# Patient Record
Sex: Male | Born: 1965 | Race: White | Hispanic: No | Smoking: Never smoker
Health system: Southern US, Community
[De-identification: ages and names within clinical notes are randomized; demographics above are authoritative.]

## PROBLEM LIST (undated history)

## (undated) DIAGNOSIS — T7840XA Allergy, unspecified, initial encounter: Secondary | ICD-10-CM

## (undated) DIAGNOSIS — Z889 Allergy status to unspecified drugs, medicaments and biological substances status: Secondary | ICD-10-CM

## (undated) DIAGNOSIS — I10 Essential (primary) hypertension: Secondary | ICD-10-CM

## (undated) DIAGNOSIS — J45909 Unspecified asthma, uncomplicated: Secondary | ICD-10-CM

## (undated) DIAGNOSIS — E7800 Pure hypercholesterolemia, unspecified: Secondary | ICD-10-CM

## (undated) DIAGNOSIS — K219 Gastro-esophageal reflux disease without esophagitis: Secondary | ICD-10-CM

## (undated) HISTORY — PX: HERNIA REPAIR: SHX51

## (undated) HISTORY — DX: Essential (primary) hypertension: I10

## (undated) HISTORY — DX: Gastro-esophageal reflux disease without esophagitis: K21.9

## (undated) HISTORY — DX: Unspecified asthma, uncomplicated: J45.909

## (undated) HISTORY — DX: Allergy status to unspecified drugs, medicaments and biological substances: Z88.9

## (undated) HISTORY — DX: Pure hypercholesterolemia, unspecified: E78.00

## (undated) HISTORY — DX: Allergy, unspecified, initial encounter: T78.40XA

---

## 2004-10-02 ENCOUNTER — Encounter: Admission: RE | Admit: 2004-10-02 | Discharge: 2004-10-02 | Payer: Self-pay | Admitting: Family Medicine

## 2013-02-13 ENCOUNTER — Ambulatory Visit: Payer: Self-pay | Admitting: Cardiology

## 2013-02-21 ENCOUNTER — Encounter: Payer: Self-pay | Admitting: Cardiology

## 2013-02-22 ENCOUNTER — Encounter: Payer: Self-pay | Admitting: Cardiology

## 2013-02-22 ENCOUNTER — Ambulatory Visit (INDEPENDENT_AMBULATORY_CARE_PROVIDER_SITE_OTHER): Payer: BC Managed Care – PPO | Admitting: Cardiology

## 2013-02-22 VITALS — BP 102/68 | HR 68 | Ht 73.0 in | Wt 196.0 lb

## 2013-02-22 DIAGNOSIS — R9431 Abnormal electrocardiogram [ECG] [EKG]: Secondary | ICD-10-CM

## 2013-02-22 DIAGNOSIS — R0789 Other chest pain: Secondary | ICD-10-CM

## 2013-02-22 DIAGNOSIS — R002 Palpitations: Secondary | ICD-10-CM

## 2013-02-22 LAB — BASIC METABOLIC PANEL
BUN: 11 mg/dL (ref 6–23)
CO2: 27 mEq/L (ref 19–32)
Calcium: 9.9 mg/dL (ref 8.4–10.5)
Chloride: 102 mEq/L (ref 96–112)
Creatinine, Ser: 0.9 mg/dL (ref 0.4–1.5)
GFR: 96.01 mL/min (ref >60.00)
GLUCOSE: 89 mg/dL (ref 70–99)
POTASSIUM: 4.2 meq/L (ref 3.5–5.1)
Sodium: 137 mEq/L (ref 135–145)

## 2013-02-22 LAB — TSH: TSH: 1.28 u[IU]/mL (ref 0.35–5.50)

## 2013-02-22 NOTE — Patient Instructions (Signed)
Your physician has requested that you have an echocardiogram. Echocardiography is a painless test that uses sound waves to create images of your heart. It provides your doctor with information about the size and shape of your heart and how well your heart's chambers and valves are working. This procedure takes approximately one hour. There are no restrictions for this procedure.  Your physician recommends that you have lab work TODAY: TSH,BMET  Your physician recommends that you schedule a follow-up appointment in: prn

## 2013-02-22 NOTE — Progress Notes (Signed)
[facility] [address], [place]  [address] Phone: [phone] Fax:  [phone]  Date:  02/22/2013   ID:  Roger Foley, DOB 1965-01-27, MRN [id number]  PCP:  No primary provider on file.   History of Present Illness: Roger Foley is a 48 y.o. male here for evaluation of chest discomfort, palpitations. Had flu like illness for 3 weeks over Christmas.  Has a history of hyperlipidemia, asthma, nonsmoker who has been experiencing chest pains/discomfort feeling like a burp or hiccup that fully doesn't develop. Can feel 10 episodes of irregular heartbeat as well. Each episode usually lasts a few seconds duration. He feels the sensation of cough and when he does cough, it seems to resolve his palpitations. Denies any significant shortness of breath, syncope, dizziness. Denies GERD. During exercise, is not experiencing any chest pain. His anus may be occurring on a treadmill periodically. He was started on Nexium to see if this helped intermittantely. EKG performed demonstrated T wave inversion in lead V1 and V2, mostly flattening in V2. Overall sinus rhythm rate 65 with no other abnormalities.   Over the past week or so, he has felt less palpitations.   Wt Readings from Last 3 Encounters:  02/22/13 196 lb (88.905 kg)     Past Medical History  Diagnosis Date  . History of seasonal allergies   . Asthma   . GERD (gastroesophageal reflux disease)   . Hypercholesteremia     No past surgical history on file.  Current Outpatient Prescriptions  Medication Sig Dispense Refill  . mometasone-formoterol (DULERA) 100-5 MCG/ACT AERO Inhale 2 puffs into the lungs 2 (two) times daily.      . Multiple Vitamin (MULTIVITAMIN) tablet Take 1 tablet by mouth daily.       No current facility-administered medications for this visit.    Allergies:   No Known Allergies  Social History:  The patient  reports that he has never smoked. He does not have any smokeless tobacco history on  file. He reports that he drinks alcohol. He reports that he does not use illicit drugs.   No family history on file. No early CAD history.   ROS:  Please see the history of present illness.   Denies any strokelike symptoms, syncope, fevers, orthopnea, PND, rash.   All other systems reviewed and negative.   PHYSICAL EXAM: VS:  BP 102/68  Pulse 68  Ht 6\' 1"  (1.854 m)  Wt 196 lb (88.905 kg)  BMI 25.86 kg/m2 Well nourished, well developed, in no acute distress HEENT: normal, [place]/AT, EOMI Neck: no JVD, normal carotid upstroke, no bruit Cardiac:  normal S1, S2; RRR; no murmur Lungs:  clear to auscultation bilaterally, no wheezing, rhonchi or rales Abd: soft, nontender, no hepatomegaly, no bruits Ext: no edema, 2+ distal pulses Skin: warm and dry GU: deferred Neuro: no focal abnormalities noted, AAO x 3  EKG:  Sinus rhythm, T-wave flattening in V2, T wave inversion V1. Personally viewed.     ASSESSMENT AND PLAN:  1. Atypical chest pain-nonexertional. He does not describe it as a pain in particular but sometimes a burp-like sensation. For now, we'll continue to monitor. Nexium as necessary. I do not feel strongly based upon his symptoms and excellent exercise tolerance that a stress test as an absolutely necessary. If symptoms worsen or become more worrisome, he will let me know. 2. Palpitations-could be paroxysmal atrial tachycardia, SVT, PACs, PVCs or potentially atrial fibrillation. Seems to be improving. Decrease caffeine  use. Avoid Sudafed. I will check a basic metabolic profile and TSH. If symptoms worsen, I will have low threshold for Holter monitor placement. 3. Abnormal EKG-T-wave changes are fairly nonspecific on his EKG. I will check an echocardiogram to ensure that there no changes in structure and function based upon his symptoms and EKG. 4. We will see him back on as-needed basis. If necessary, may trial low-dose beta blocker or calcium channel blocker to help extinguish  palpitations.  Signed, Candee Furbish, MD [facility]  02/22/2013 11:28 AM

## 2013-03-02 ENCOUNTER — Other Ambulatory Visit (HOSPITAL_COMMUNITY): Payer: BC Managed Care – PPO

## 2013-03-22 ENCOUNTER — Ambulatory Visit (HOSPITAL_COMMUNITY): Payer: BC Managed Care – PPO | Attending: Cardiology | Admitting: Radiology

## 2013-03-22 DIAGNOSIS — R079 Chest pain, unspecified: Secondary | ICD-10-CM | POA: Insufficient documentation

## 2013-03-22 DIAGNOSIS — I079 Rheumatic tricuspid valve disease, unspecified: Secondary | ICD-10-CM | POA: Insufficient documentation

## 2013-03-22 DIAGNOSIS — R0789 Other chest pain: Secondary | ICD-10-CM

## 2013-03-22 DIAGNOSIS — E785 Hyperlipidemia, unspecified: Secondary | ICD-10-CM | POA: Insufficient documentation

## 2013-03-22 DIAGNOSIS — R002 Palpitations: Secondary | ICD-10-CM | POA: Insufficient documentation

## 2013-03-22 DIAGNOSIS — R9431 Abnormal electrocardiogram [ECG] [EKG]: Secondary | ICD-10-CM

## 2013-03-22 NOTE — Progress Notes (Signed)
Echocardiogram Performed. 

## 2013-03-23 ENCOUNTER — Telehealth: Payer: Self-pay | Admitting: *Deleted

## 2013-03-23 ENCOUNTER — Telehealth: Payer: Self-pay | Admitting: Cardiology

## 2013-03-23 NOTE — Telephone Encounter (Signed)
lmtcb for echo results at 8:50am. number provided

## 2013-03-23 NOTE — Telephone Encounter (Signed)
New message    Test results

## 2013-03-28 NOTE — Telephone Encounter (Signed)
Pt is aware of ECHO results Debbie Eevie Lapp RN  

## 2017-08-24 ENCOUNTER — Other Ambulatory Visit (HOSPITAL_BASED_OUTPATIENT_CLINIC_OR_DEPARTMENT_OTHER): Payer: Self-pay | Admitting: Family Medicine

## 2017-08-24 DIAGNOSIS — R319 Hematuria, unspecified: Secondary | ICD-10-CM

## 2017-08-25 ENCOUNTER — Ambulatory Visit (HOSPITAL_BASED_OUTPATIENT_CLINIC_OR_DEPARTMENT_OTHER)
Admission: RE | Admit: 2017-08-25 | Discharge: 2017-08-25 | Disposition: A | Discharge status: Home / Self Care | Payer: BLUE CROSS/BLUE SHIELD | Source: Ambulatory Visit | Attending: Family Medicine | Admitting: Family Medicine

## 2017-08-25 DIAGNOSIS — K439 Ventral hernia without obstruction or gangrene: Secondary | ICD-10-CM | POA: Insufficient documentation

## 2017-08-25 DIAGNOSIS — K5730 Diverticulosis of large intestine without perforation or abscess without bleeding: Secondary | ICD-10-CM | POA: Insufficient documentation

## 2017-08-25 DIAGNOSIS — R319 Hematuria, unspecified: Secondary | ICD-10-CM | POA: Insufficient documentation

## 2017-12-14 ENCOUNTER — Other Ambulatory Visit (HOSPITAL_BASED_OUTPATIENT_CLINIC_OR_DEPARTMENT_OTHER): Payer: Self-pay | Admitting: Physician Assistant

## 2017-12-14 DIAGNOSIS — R1031 Right lower quadrant pain: Secondary | ICD-10-CM

## 2017-12-21 ENCOUNTER — Ambulatory Visit (HOSPITAL_BASED_OUTPATIENT_CLINIC_OR_DEPARTMENT_OTHER)
Admission: RE | Admit: 2017-12-21 | Discharge: 2017-12-21 | Disposition: A | Discharge status: Home / Self Care | Payer: Self-pay | Source: Ambulatory Visit | Attending: Physician Assistant | Admitting: Physician Assistant

## 2017-12-21 DIAGNOSIS — K429 Umbilical hernia without obstruction or gangrene: Secondary | ICD-10-CM | POA: Insufficient documentation

## 2017-12-21 DIAGNOSIS — R1031 Right lower quadrant pain: Secondary | ICD-10-CM

## 2019-03-03 IMAGING — US US PELVIS LIMITED
1 series · 14 of 25 positions shown · non-contrast
Comparison: None.

Correlation: CT abdomen and pelvis 08/23/2017.

CLINICAL DATA: RIGHT lower quadrant pain after exercise of physical
exertion

EXAM:
LIMITED ULTRASOUND OF PELVIS
TECHNIQUE: Limited transabdominal ultrasound examination of the pelvis was
performed at the level of the umbilicus.

[Series 1: us pelvis limited · 0.07mm/px · 25 acquisitions, 14 frames shown]
[im 1/25]
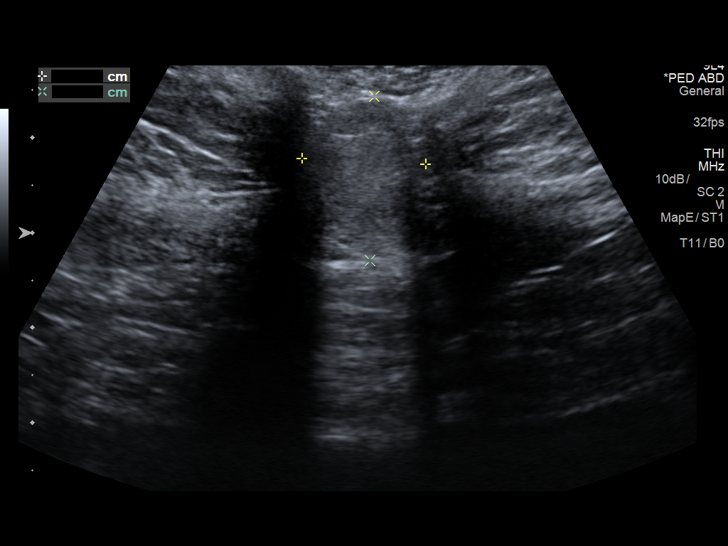
[im 3/25]
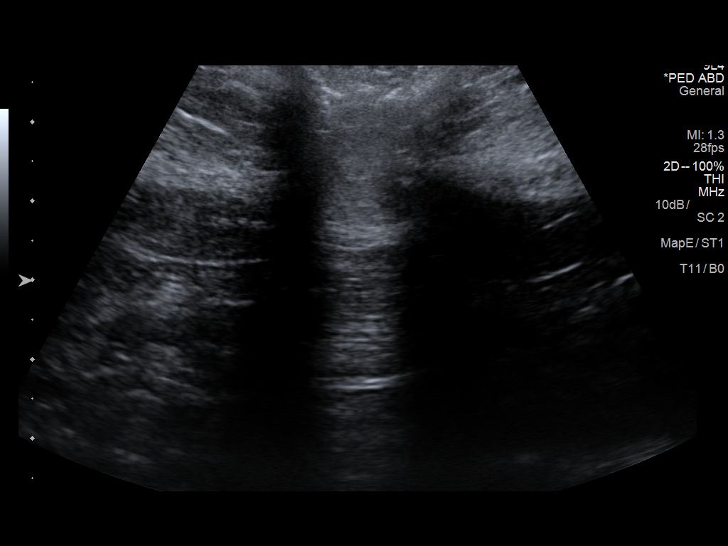
[im 5/25]
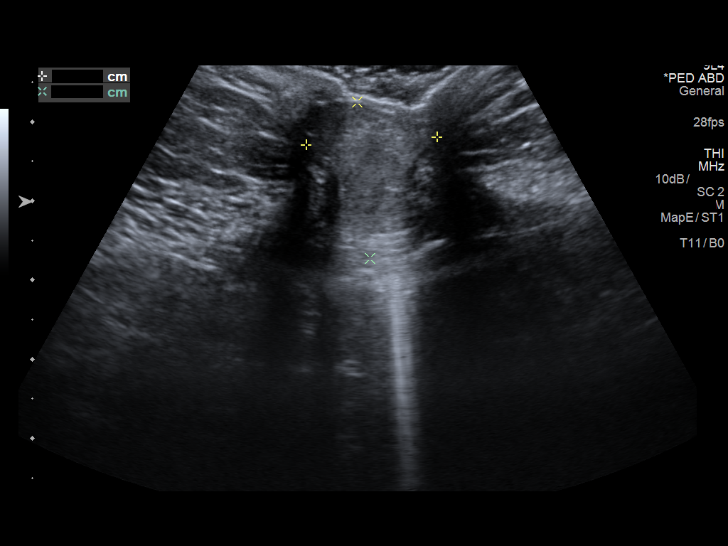
[im 7/25]
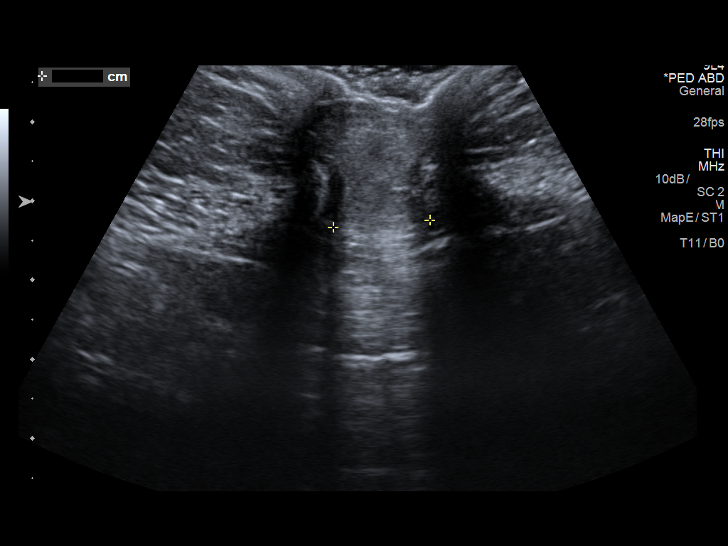
[im 9/25]
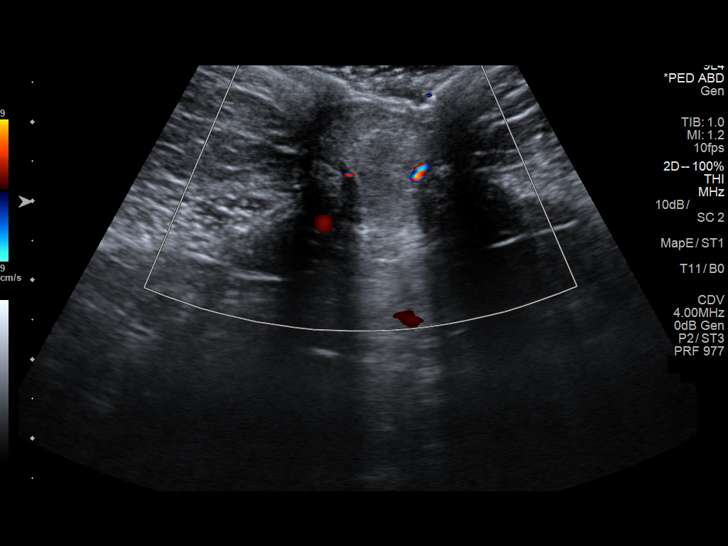
[im 10/25]
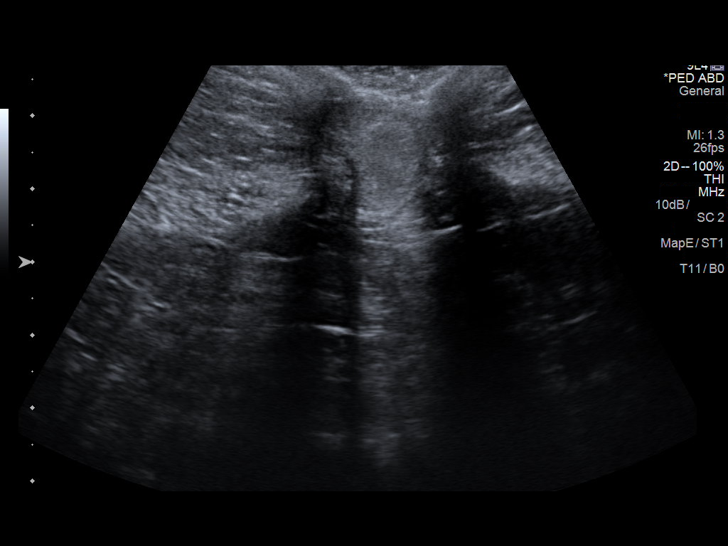
[im 12/25]
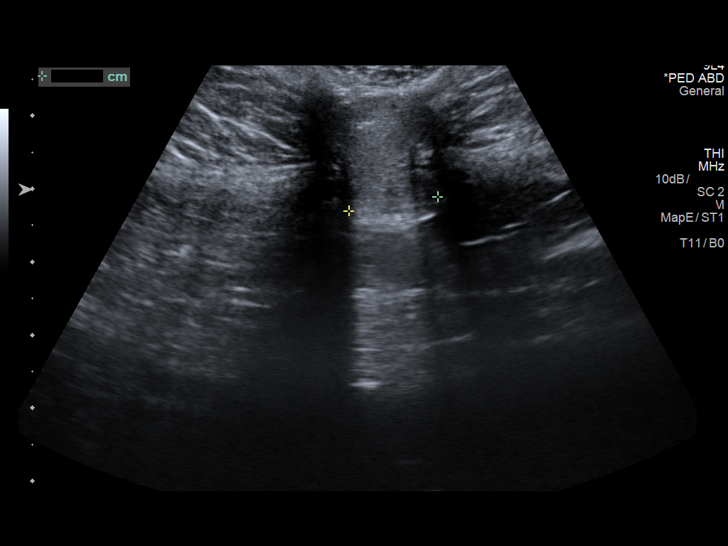
[im 14/25]
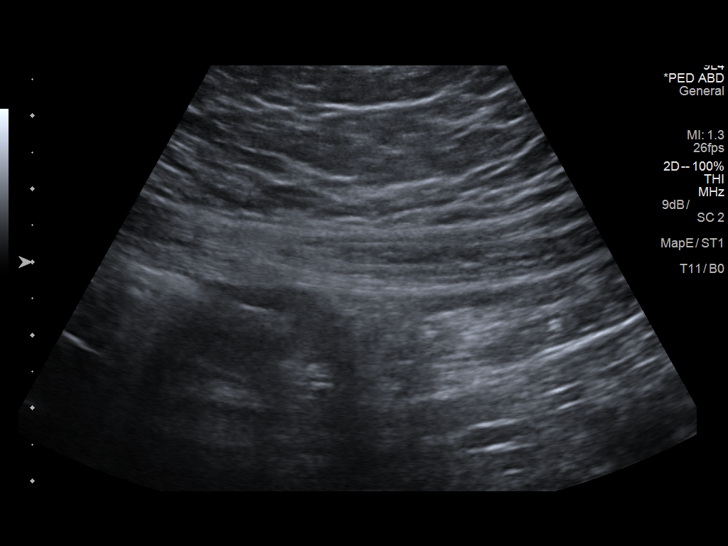
[im 16/25]
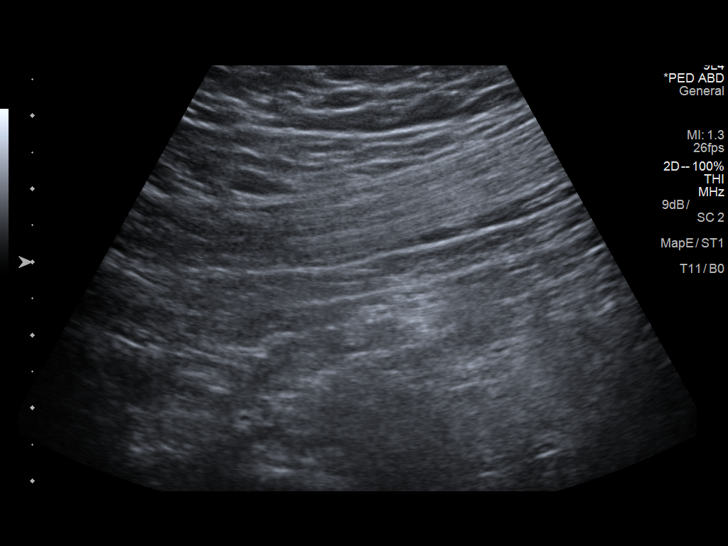
[im 17/25]
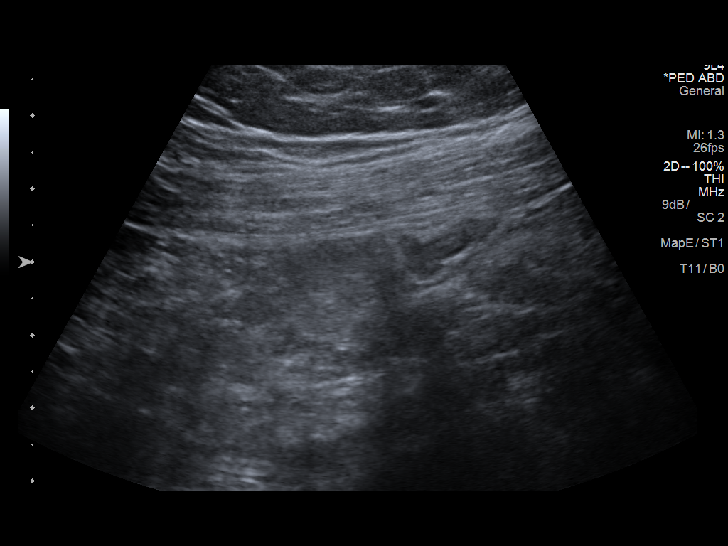
[im 19/25]
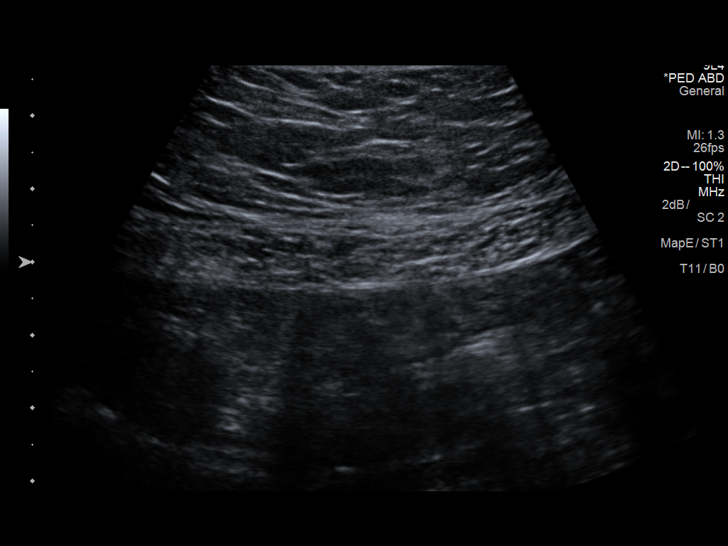
[im 21/25]
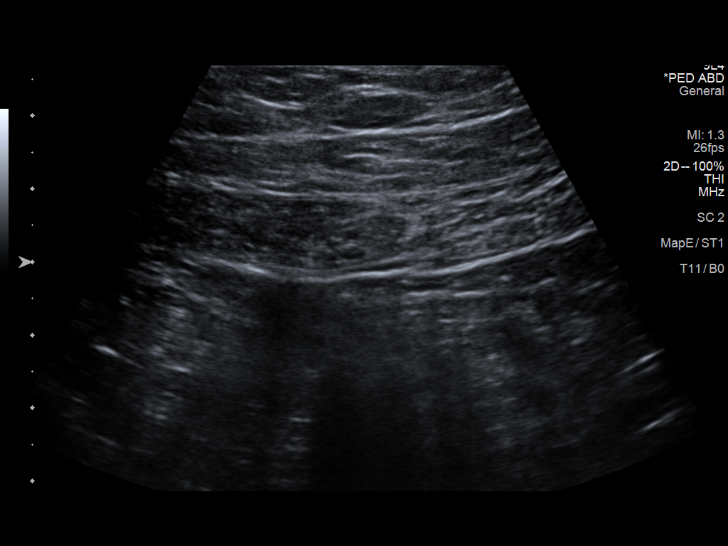
[im 23/25]
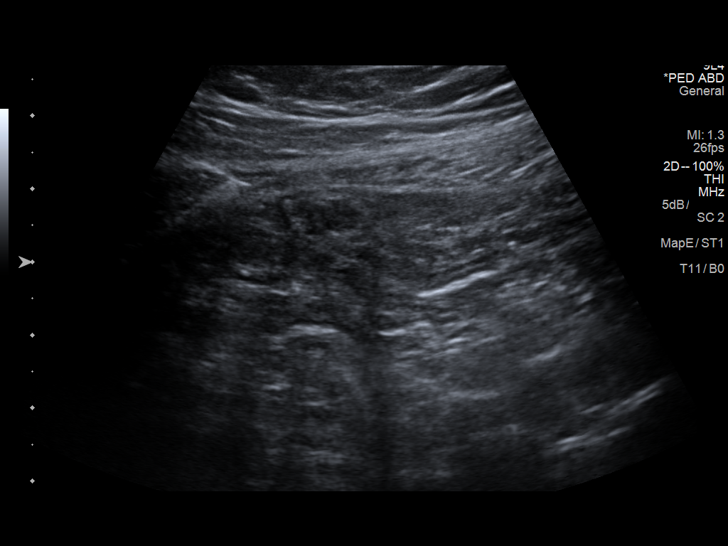
[im 25/25]
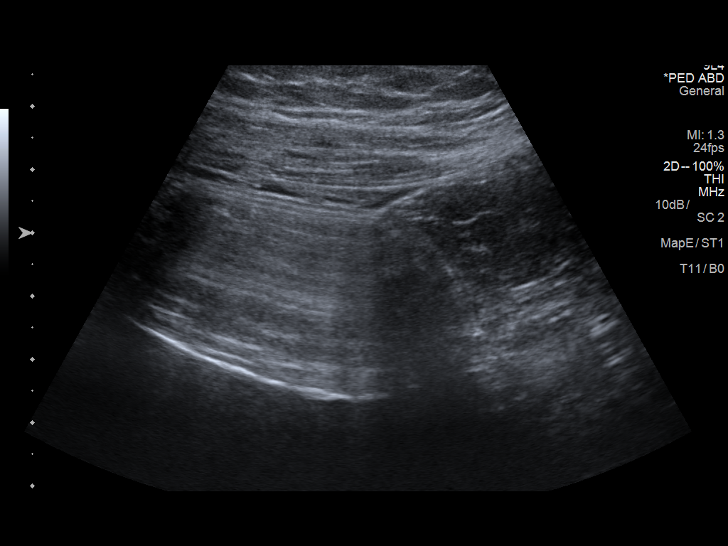

[14 of 25 positions shown; findings below may reference images not displayed]

FINDINGS: At the level of the umbilicus, a fascial defect is identified
containing slightly heterogeneous tissue which is isoechoic to
subcutaneous fat.

Fascial defect measures approximately 12 x 13 mm in size.

Finding is most consistent with a small umbilical hernia.

A small umbilical hernia is present on the prior CT exam.

No bowel herniation or fluid are identified.

No definite solid mass is seen.
IMPRESSION: Small umbilical hernia containing fat, with fascial defect measuring
approximately 12 x 13 mm.

Lesion corresponds to a small umbilical hernia containing fat seen
on a recent CT exam.

## 2020-02-19 IMAGING — CT CT RENAL STONE PROTOCOL
2 of 4 series · 15 of 46 positions shown, 17 images · non-contrast
Comparison: None.

CLINICAL DATA: Left flank pain and hematuria

EXAM:
CT ABDOMEN AND PELVIS WITHOUT CONTRAST
TECHNIQUE: Multidetector CT imaging of the abdomen and pelvis was performed
following the standard protocol without oral or IV contrast.

[Series 2: axial st · axial · 0.97mm/px · z∈[-522,-47]mm · 12 of 109 slices shown, 14 images]
[im 9/109  soft-tissue]
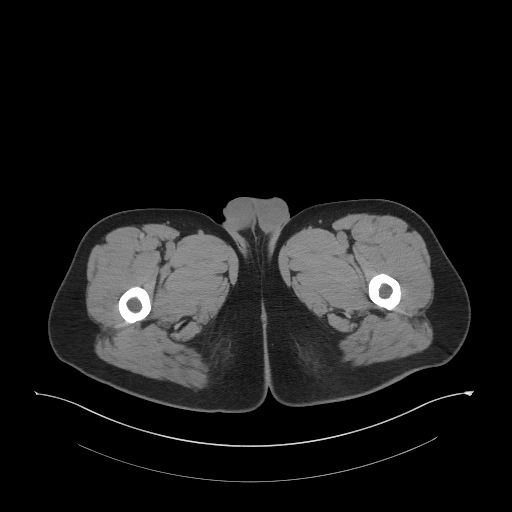
[im 9/109  bone]
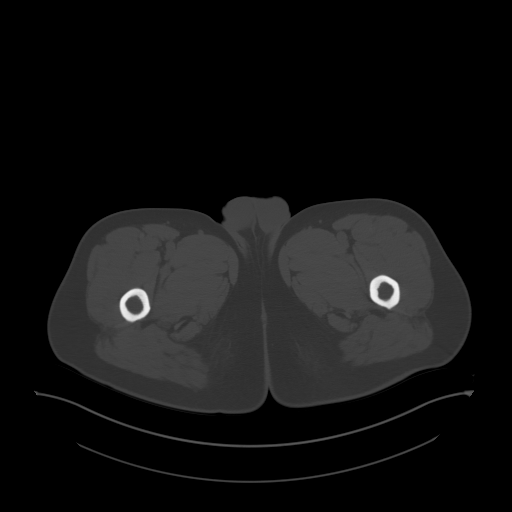
[im 18/109  soft-tissue]
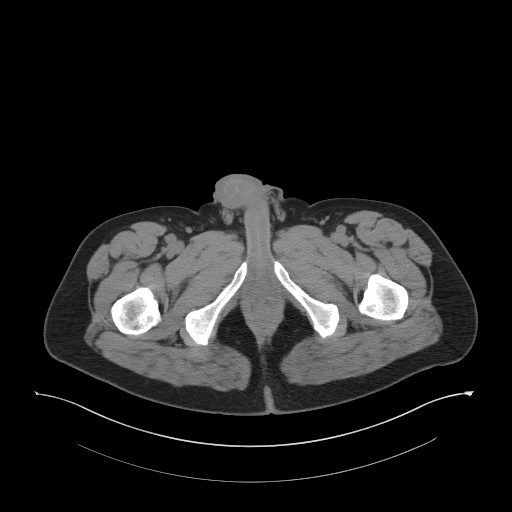
[im 26/109  soft-tissue]
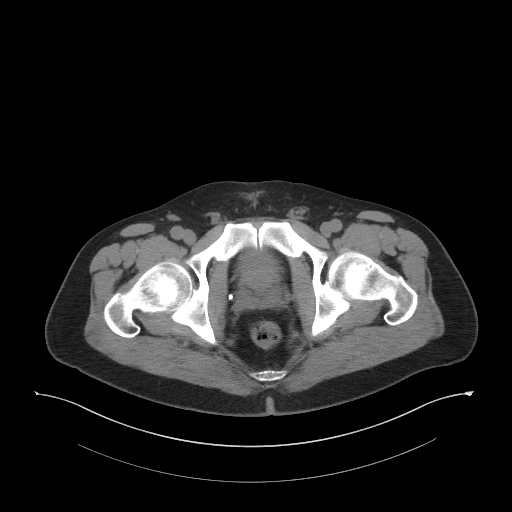
[im 35/109  soft-tissue]
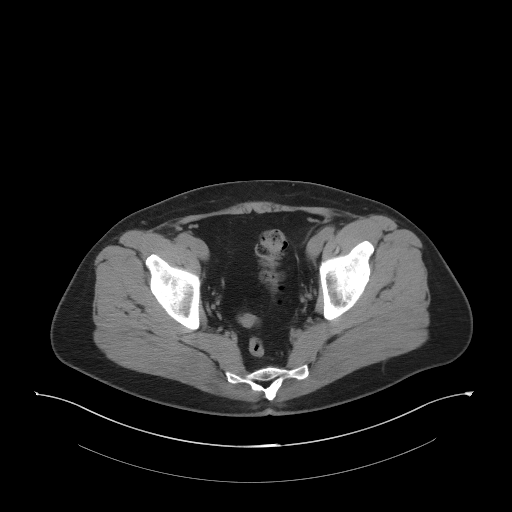
[im 44/109  soft-tissue]
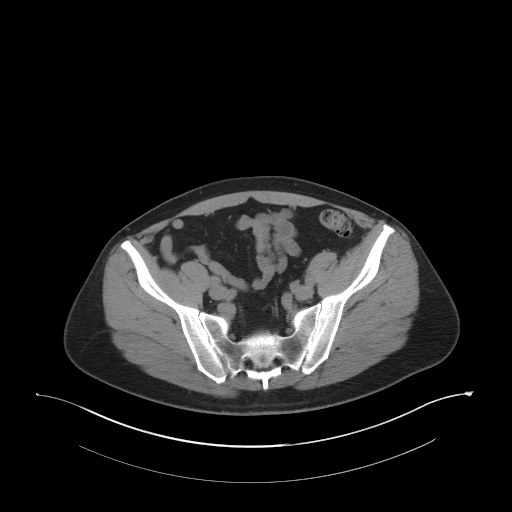
[im 52/109  soft-tissue]
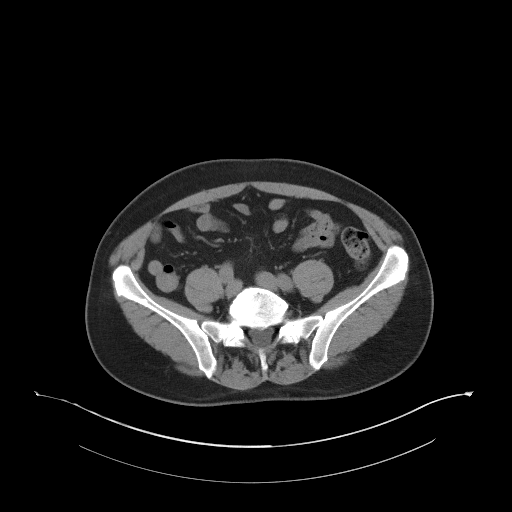
[im 61/109  soft-tissue]
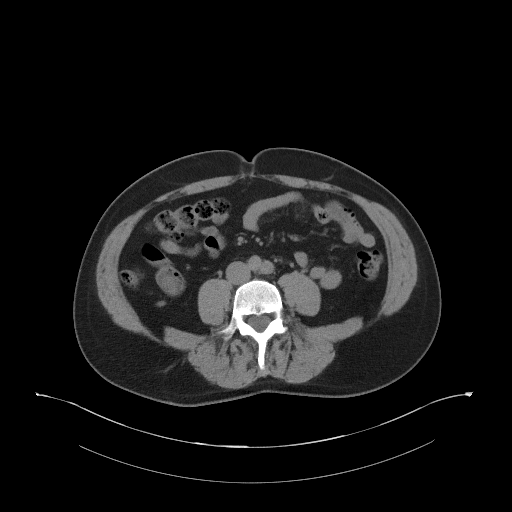
[im 70/109  soft-tissue]
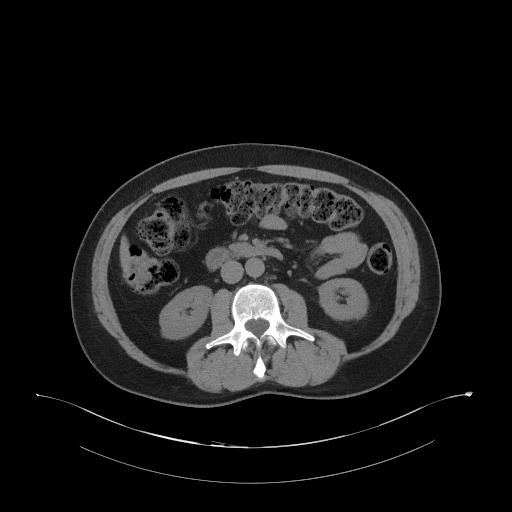
[im 78/109  soft-tissue]
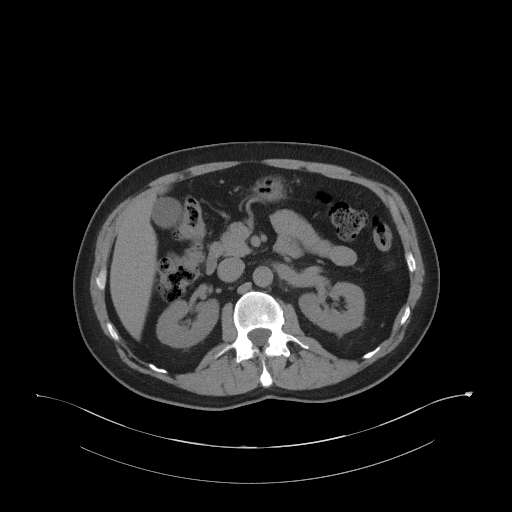
[im 78/109  bone]
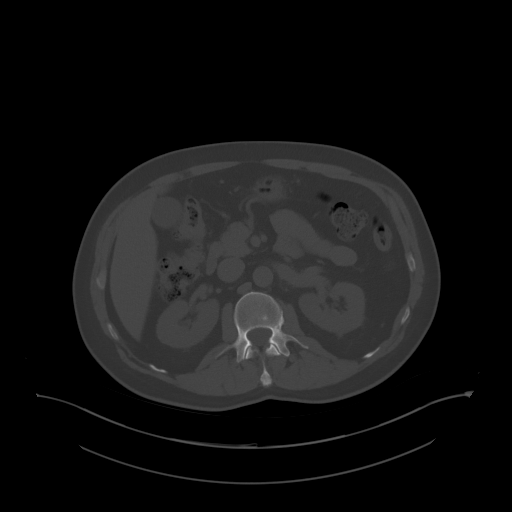
[im 87/109  soft-tissue]
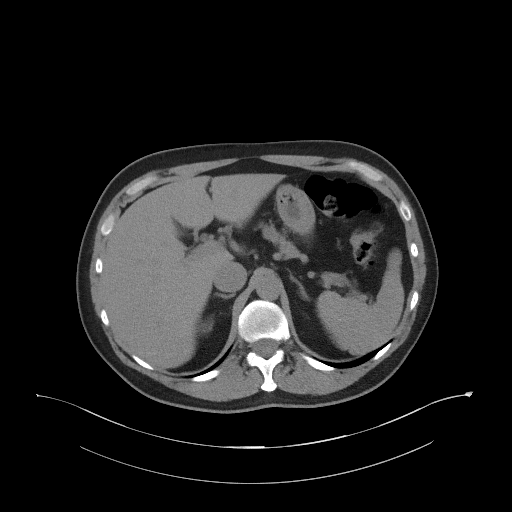
[im 96/109  soft-tissue]
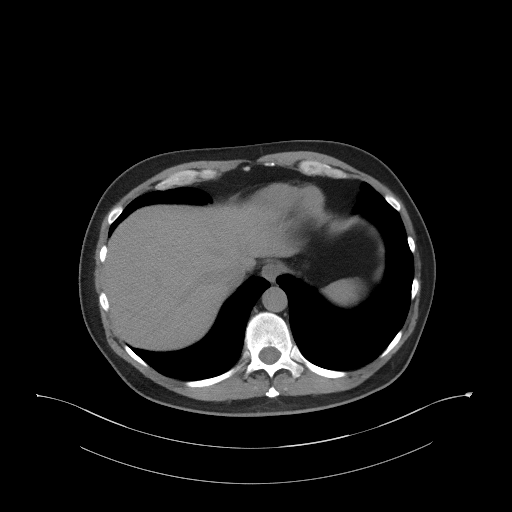
[im 104/109  soft-tissue]
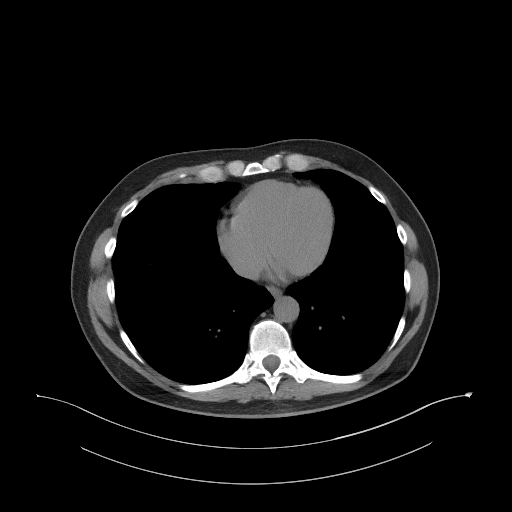

[Series 4: coronal st · coronal · 0.81mm/px · 3 of 92 slices shown]
[im 31/92  soft-tissue]
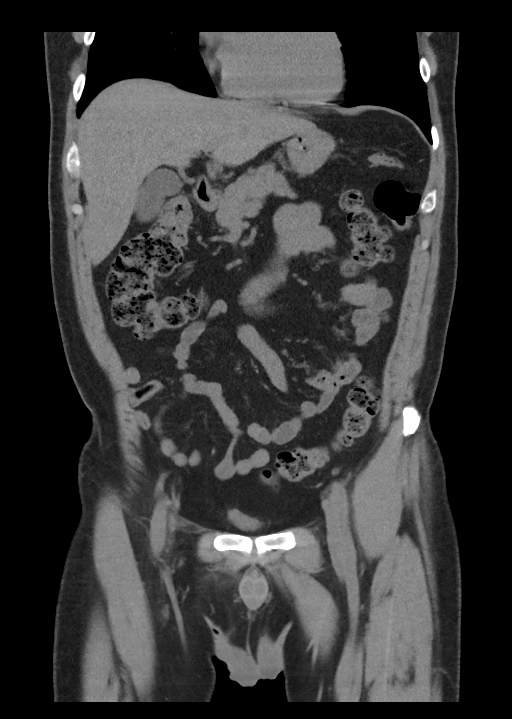
[im 41/92  soft-tissue]
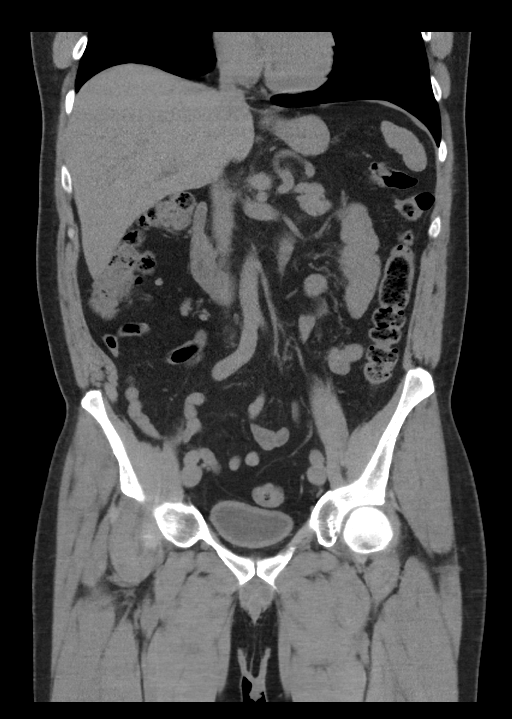
[im 51/92  soft-tissue]
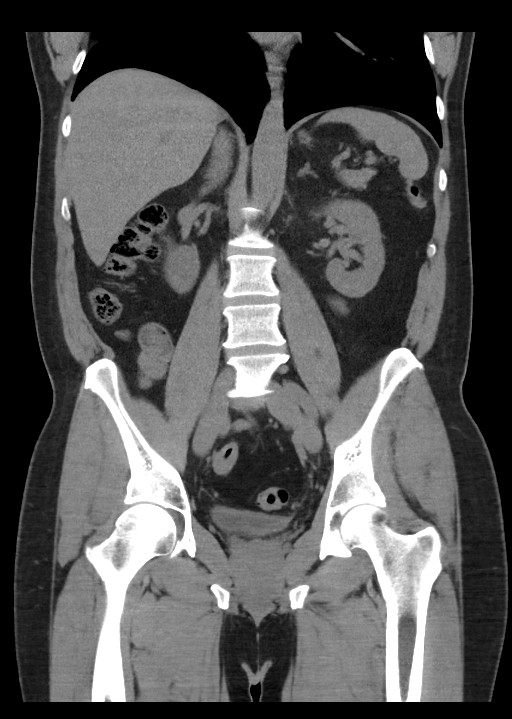

[15 of 46 positions shown; findings below may reference images not displayed]

FINDINGS: Lower chest: Lung bases are clear.

Hepatobiliary: No focal liver lesions are appreciable on this
noncontrast enhanced study. Gallbladder wall is not appreciably
thickened. There is no biliary duct dilatation.

Pancreas: No pancreatic mass or inflammatory focus.

Spleen: No splenic lesions are evident.

Adrenals/Urinary Tract: Adrenals bilaterally appear normal. Kidneys
bilaterally show no evident mass or hydronephrosis on either side.
There is no appreciable renal or ureteral calculus on either side.
Urinary bladder is midline with wall thickness mildly increased.

Stomach/Bowel: There are multiple sigmoid diverticula without
evident diverticulitis. There is no appreciable bowel wall or
mesenteric thickening. There is moderate stool in the colon. There
is no evident bowel obstruction. No free air or portal venous air.

Vascular/Lymphatic: There is no abdominal aortic aneurysm. No
vascular lesions are evident on this noncontrast enhanced study. No
adenopathy is appreciable in the abdomen or pelvis.

Reproductive: Prostate and seminal vesicles are normal in size and
contour. No evident pelvic mass.

Other: The appendix appears normal. There is no abscess or ascites
in the abdomen or pelvis. There is a rather minimal ventral hernia
containing only fat.

Musculoskeletal: There is degenerative change in the lumbar spine.
There is mild vacuum phenomenon at L5-S1. There are no evident
blastic or lytic bone lesions. There is no intramuscular or
abdominal wall lesion.
IMPRESSION: 1. No evident renal or ureteral calculus. No hydronephrosis. Urinary
bladder wall appears mildly thickened. Question a degree of
cystitis. Correlation with urinalysis in this regard advised.

2. Sigmoid diverticula without diverticulitis. No bowel obstruction.
No abscess in the abdomen pelvis. Appendix appears normal.

3.  Small ventral hernia containing only fat.

## 2020-12-19 ENCOUNTER — Encounter: Payer: Self-pay | Admitting: Gastroenterology

## 2021-01-26 ENCOUNTER — Ambulatory Visit (AMBULATORY_SURGERY_CENTER): Payer: BLUE CROSS/BLUE SHIELD | Admitting: *Deleted

## 2021-01-26 ENCOUNTER — Other Ambulatory Visit: Payer: Self-pay

## 2021-01-26 VITALS — Ht 73.0 in | Wt 200.0 lb

## 2021-01-26 DIAGNOSIS — R195 Other fecal abnormalities: Secondary | ICD-10-CM

## 2021-01-26 DIAGNOSIS — Z1211 Encounter for screening for malignant neoplasm of colon: Secondary | ICD-10-CM

## 2021-01-26 MED ORDER — PLENVU 140 G PO SOLR: 1.0000 | Freq: Once | ORAL | 0 refills | Status: AC

## 2021-01-26 NOTE — Progress Notes (Signed)
No egg or soy allergy known to patient  No issues known to pt with past sedation with any surgeries or procedures Patient denies ever being told they had issues or difficulty with intubation  No FH of Malignant Hyperthermia Pt is not on diet pills Pt is not on  home 02  Pt is not on blood thinners  Pt denies issues with constipation  No A fib or A flutter  Pt is fully vaccinated  for Covid   plenvu Coupon to pt in PV today , Code to Pharmacy and  NO PA's for preps discussed with pt In PV today  Discussed with pt there will be an out-of-pocket cost for prep and that varies from $0 to 70 +  dollars - pt verbalized understanding   Due to the COVID-19 pandemic we are asking patients to follow certain guidelines in PV and the [place]   Pt aware of COVID protocols and [facility] guidelines   PV completed over the phone. Pt verified name, DOB, address and insurance during PV today.  Pt mailed instruction packet with copy of consent form to read and not return, and instructions.  Pt encouraged to call with questions or issues.

## 2021-02-09 ENCOUNTER — Ambulatory Visit (AMBULATORY_SURGERY_CENTER): Payer: Managed Care, Other (non HMO) | Admitting: Gastroenterology

## 2021-02-09 ENCOUNTER — Other Ambulatory Visit: Payer: Self-pay

## 2021-02-09 ENCOUNTER — Encounter: Payer: Self-pay | Admitting: Gastroenterology

## 2021-02-09 VITALS — BP 118/76 | HR 79 | Temp 97.5°F | Resp 11 | Ht 73.0 in | Wt 200.0 lb

## 2021-02-09 DIAGNOSIS — D125 Benign neoplasm of sigmoid colon: Secondary | ICD-10-CM

## 2021-02-09 DIAGNOSIS — R195 Other fecal abnormalities: Secondary | ICD-10-CM

## 2021-02-09 DIAGNOSIS — D123 Benign neoplasm of transverse colon: Secondary | ICD-10-CM

## 2021-02-09 MED ORDER — SODIUM CHLORIDE 0.9 % IV SOLN: 500.0000 mL | INTRAVENOUS | Status: DC

## 2021-02-09 NOTE — Op Note (Signed)
[facility] Patient Name: Roger Foley Procedure Date: 02/09/2021 7:10 AM MRN: [id number] Endoscopist: Mallie Mussel L. Loletha Carrow , MD Age: 56 Referring MD:  Date of Birth: January 08, 1965 Gender: Male Account #: [id number] Procedure:                Colonoscopy Indications:              Positive Cologuard test Medicines:                Monitored Anesthesia Care Procedure:                Pre-Anesthesia Assessment:                           - Prior to the procedure, a History and Physical                            was performed, and patient medications and                            allergies were reviewed. The patient's tolerance of                            previous anesthesia was also reviewed. The risks                            and benefits of the procedure and the sedation                            options and risks were discussed with the patient.                            All questions were answered, and informed consent                            was obtained. Prior Anticoagulants: The patient has                            taken no previous anticoagulant or antiplatelet                            agents. ASA Grade Assessment: II - A patient with                            mild systemic disease. After reviewing the risks                            and benefits, the patient was deemed in                            satisfactory condition to undergo the procedure.                           After obtaining informed consent, the colonoscope  was passed under direct vision. Throughout the                            procedure, the patient's blood pressure, pulse, and                            oxygen saturations were monitored continuously. The                            Olympus CF-HQ190L [id number]) (and adult colonoscope                            081) Colonoscope was introduced through the anus                            and advanced to the the cecum,  identified by                            appendiceal orifice and ileocecal valve. The                            colonoscopy was somewhat difficult. The patient                            tolerated the procedure well. The quality of the                            bowel preparation was fair. The ileocecal valve,                            appendiceal orifice, and rectum were photographed.                            The bowel preparation used was Plenvu. Scope In: 8:09:50 AM Scope Out: 9:12:26 AM Scope Withdrawal Time: 1 hour 1 minute 11 seconds  Total Procedure Duration: 1 hour 2 minutes 36 seconds  Findings:                 The perianal and digital rectal examinations were                            normal.                           Two sessile polyps were found in the proximal                            transverse colon and distal transverse colon. The                            polyps were 3 to 8 mm in size. These polyps were                            removed with a cold snare. Resection and retrieval  were complete.                           An 18-20 mm polyp was found in the mid transverse                            colon. The polyp was sessile. The polyp was removed                            with a piecemeal technique using a hot snare.                            Resection and retrieval were complete. To prevent                            bleeding post-intervention, one hemostatic clip was                            successfully placed (MR conditional). Area was                            tattooed with an injection of 0.5 mL of Spot                            (carbon black).                           A 10 mm polyp was found in the mid sigmoid colon.                            The polyp was pedunculated. The polyp was removed                            with a hot snare. Resection and retrieval were                            complete. To stop active  bleeding, three hemostatic                            clips were successfully placed (MR conditional).                            There was no bleeding at the end of the procedure.                            Area was successfully injected with 0.46mL of a                            1:100,000 solution of epinephrine for hemostasis                            (base of stalk).  An 18 mm polyp was found in the distal rectum.(one                            clip initially deployed in good position, then                            failed) The polyp was flat and sessile (see photo).                            Not removed.                           The exam was otherwise without abnormality on                            direct and retroflexion views. Complications:            No immediate complications. Estimated Blood Loss:     Estimated blood loss: 200 mL. Impression:               - Preparation of the colon was fair.                           - Two 3 to 8 mm polyps in the proximal transverse                            colon and in the distal transverse colon, removed                            with a cold snare. Resected and retrieved.                           - One 18-20 mm polyp in the mid transverse colon,                            removed piecemeal using a hot snare. Resected and                            retrieved. Clip (MR conditional) was placed.                            Tattooed.                           - One 10 mm polyp in the mid sigmoid colon, removed                            with a hot snare. Resected and retrieved. Clips (MR                            conditional) were placed. Injected.                           - One 18 mm polyp in the distal rectum. Not  removed.                           - The examination was otherwise normal on direct                            and retroflexion views. Recommendation:           - Patient has a contact number  available for                            emergencies. The signs and symptoms of potential                            delayed complications were discussed with the                            patient. Return to normal activities tomorrow.                            Written discharge instructions were provided to the                            patient.                           - Resume previous diet.                           - No aspirin, ibuprofen, naproxen, or other                            non-steroidal anti-inflammatory drugs for 7 days                            after polyp removal.                           - Await pathology results.                           - Repeat colonoscopy in approximately 4 months for                            surveillance of polypectomy sites and EMR of rectal                            polyp. Lubertha Leite L. Loletha Carrow, MD 02/09/2021 9:26:23 AM This report has been signed electronically.

## 2021-02-09 NOTE — Progress Notes (Signed)
History and Physical:  This patient presents for endoscopic testing for: Encounter Diagnoses  Name Primary?   Special screening for malignant neoplasms, colon Yes   Positive colorectal cancer screening using Cologuard test     Patient denies chronic abdominal pain, rectal bleeding, constipation or diarrhea.   ROS: Patient denies chest pain or cough   Past Medical History: Past Medical History:  Diagnosis Date   Allergy    seasonal   Asthma    GERD (gastroesophageal reflux disease)    History of seasonal allergies    Hypercholesteremia    Hypertension      Past Surgical History: Past Surgical History:  Procedure Laterality Date   HERNIA REPAIR     18 months ago    Allergies: No Known Allergies  Outpatient Meds: Current Outpatient Medications  Medication Sig Dispense Refill   aspirin 325 MG tablet Take by mouth.     hydrochlorothiazide (HYDRODIURIL) 25 MG tablet Take 25 mg by mouth every morning.     Albuterol Sulfate (PROAIR RESPICLICK) 742 (90 Base) MCG/ACT AEPB Inhale into the lungs.     mometasone-formoterol (DULERA) 100-5 MCG/ACT AERO Inhale 2 puffs into the lungs 2 (two) times daily.     Multiple Vitamin (MULTIVITAMIN) tablet Take 1 tablet by mouth daily.     rosuvastatin (CRESTOR) 10 MG tablet Take 10 mg by mouth at bedtime.     Current Facility-Administered Medications  Medication Dose Route Frequency Provider Last Rate Last Admin   0.9 %  sodium chloride infusion  500 mL Intravenous Continuous Danis, Estill Cotta III, MD          ___________________________________________________________________ Objective   Exam:  BP 119/85    Pulse 90    Temp (!) 97.5 F (36.4 C)    Resp (!) 9    Ht 6\' 1"  (1.854 m)    Wt 200 lb (90.7 kg)    SpO2 100%    BMI 26.39 kg/m   CV: RRR without murmur, S1/S2 Resp: clear to auscultation bilaterally, normal RR and effort noted GI: soft, no tenderness, with active bowel sounds.   Assessment: Encounter Diagnoses  Name  Primary?   Special screening for malignant neoplasms, colon Yes   Positive colorectal cancer screening using Cologuard test      Plan: Colonoscopy  The benefits and risks of the planned procedure were described in detail with the patient or (when appropriate) their health care proxy.  Risks were outlined as including, but not limited to, bleeding, infection, perforation, adverse medication reaction leading to cardiac or pulmonary decompensation, pancreatitis (if ERCP).  The limitation of incomplete mucosal visualization was also discussed.  No guarantees or warranties were given.    The patient is appropriate for an endoscopic procedure in the ambulatory setting.   - Wilfrid Lund, MD

## 2021-02-09 NOTE — Progress Notes (Signed)
To PACU, VSS. Report to [email or web address] Scope changed to 081.

## 2021-02-09 NOTE — Patient Instructions (Addendum)
Handouts given for polyps.  Await pathology report.  Given Clip identification cards and instructions given.  NO ASPIRIN, ASPIRIN CONTAINING PRODUCTS (BC OR GOODY POWDERS) OR NSAIDS (IBUPROFEN, ADVIL, ALEVE, AND MOTRIN) FOR for 7 days; TYLENOL IS OK TO TAKE.  Resume previous diet.  Repeat follow up colonoscopy to be scheduled.   YOU HAD AN ENDOSCOPIC PROCEDURE TODAY AT [facility]:   Refer to the procedure report that was given to you for any specific questions about what was found during the examination.  If the procedure report does not answer your questions, please call your gastroenterologist to clarify.  If you requested that your care partner not be given the details of your procedure findings, then the procedure report has been included in a sealed envelope for you to review at your convenience later.  YOU SHOULD EXPECT: Some feelings of bloating in the abdomen. Passage of more gas than usual.  Walking can help get rid of the air that was put into your GI tract during the procedure and reduce the bloating. If you had a lower endoscopy (such as a colonoscopy or flexible sigmoidoscopy) you may notice spotting of blood in your stool or on the toilet paper. If you underwent a bowel prep for your procedure, you may not have a normal bowel movement for a few days.  Please Note:  You might notice some irritation and congestion in your nose or some drainage.  This is from the oxygen used during your procedure.  There is no need for concern and it should clear up in a day or so.  SYMPTOMS TO REPORT IMMEDIATELY:  Following lower endoscopy (colonoscopy or flexible sigmoidoscopy):  Excessive amounts of blood in the stool  Significant tenderness or worsening of abdominal pains  Swelling of the abdomen that is new, acute  Fever of 100F or higher For urgent or emergent issues, a gastroenterologist can be reached at any hour by calling [phone]. Do not use MyChart messaging for urgent  concerns.    DIET:  We do recommend a small meal at first, but then you may proceed to your regular diet.  Drink plenty of fluids but you should avoid alcoholic beverages for 24 hours.  ACTIVITY:  You should plan to take it easy for the rest of today and you should NOT DRIVE or use heavy machinery until tomorrow (because of the sedation medicines used during the test).    FOLLOW UP: Our staff will call the number listed on your records 48-72 hours following your procedure to check on you and address any questions or concerns that you may have regarding the information given to you following your procedure. If we do not reach you, we will leave a message.  We will attempt to reach you two times.  During this call, we will ask if you have developed any symptoms of COVID 19. If you develop any symptoms (ie: fever, flu-like symptoms, shortness of breath, cough etc.) before then, please call [phone].  If you test positive for Covid 19 in the 2 weeks post procedure, please call and report this information to [place].    If any biopsies were taken you will be contacted by phone or by letter within the next 1-3 weeks.  Please call us at [phone] if you have not heard about the biopsies in 3 weeks.    SIGNATURES/CONFIDENTIALITY: You and/or your care partner have signed paperwork which will be entered into your electronic medical record.  These signatures attest  to the fact that that the information above on your After Visit Summary has been reviewed and is understood.  Full responsibility of the confidentiality of this discharge information lies with you and/or your care-partner.

## 2021-02-09 NOTE — Progress Notes (Signed)
Called to room to assist during endoscopic procedure.  Patient ID and intended procedure confirmed with present staff. Received instructions for my participation in the procedure from the performing physician.  

## 2021-02-11 ENCOUNTER — Telehealth: Payer: Self-pay

## 2021-02-11 ENCOUNTER — Telehealth: Payer: Self-pay | Admitting: *Deleted

## 2021-02-11 NOTE — Telephone Encounter (Signed)
No answer on first follow up call. Left message  °

## 2021-02-11 NOTE — Telephone Encounter (Signed)
°  Follow up Call-  Call back number 02/09/2021  Post procedure Call Back phone  # [phone]  Permission to leave phone message Yes  Some recent data might be hidden     Patient questions:  Do you have a fever, pain , or abdominal swelling? No. Pain Score  0 *  Have you tolerated food without any problems? Yes.    Have you been able to return to your normal activities? Yes.    Do you have any questions about your discharge instructions: Diet   No. Medications  No. Follow up visit  No.  Do you have questions or concerns about your Care? No.  Actions: * If pain score is 4 or above: No action needed, pain <4.

## 2021-02-13 ENCOUNTER — Encounter: Payer: Self-pay | Admitting: Gastroenterology

## 2021-03-13 ENCOUNTER — Telehealth: Payer: Self-pay | Admitting: Gastroenterology

## 2021-03-13 ENCOUNTER — Other Ambulatory Visit: Payer: Self-pay

## 2021-03-13 DIAGNOSIS — K621 Rectal polyp: Secondary | ICD-10-CM

## 2021-03-13 NOTE — Telephone Encounter (Signed)
Lm on vm for patient to return call. ? ?Colonoscopy orders in epic. ?

## 2021-03-13 NOTE — Telephone Encounter (Signed)
This patient had a screening colonoscopy with me on February 6. ? ?Multiple polyps were removed, and a rectal polyp was not removed because it requires a repeat colonoscopy in the hospital endoscopy department  - special devices required to remove it. ? ?My recommendation was to do that 3 to 4 months out from the February procedure. ? ?The May schedule has been released, and I have a [place] long block on May 1. ?Those slots are limited and fill up quickly as you know, so please contact this patient and see if he is agreeable to having his repeat colonoscopy with me on that date.  If so, put him on the schedule and send same prep instructions for Plenvu that he used for the last procedure. ? ?- HD ?

## 2021-03-16 NOTE — Telephone Encounter (Signed)
Lm on mobile vm for patient to return call °

## 2021-03-18 NOTE — Telephone Encounter (Signed)
Called and spoke with patient. He states that he would like to hold of on scheduling colonoscopy at this time. He states that he is not sure what the out of pocket expense will be and would like to see what his last bill was first. Pt was interested in pushing this out at least another 6 months. I told him that I will get this information to you and see if you had any recommendations on timing of recall. Pt verbalized understanding and had no concerns at the end of the call. ?

## 2021-03-22 NOTE — Telephone Encounter (Signed)
Thank you for the note. ? ?Naturally, that is his choice to make, and I certainly appreciate the cost considerations. ? ?Please place a recall for September of this year so we do not lose track of him, and encourage him to contact us when he feels ready to schedule the procedure. ?It would likely be a 2 to 54-monthwait from the time he notifies [place] the actual procedure date given the usual outpatient backlog of procedures in the hospital endoscopy department. ? ?- HD ?

## 2021-03-23 NOTE — Telephone Encounter (Signed)
Called and spoke with patient. We reviewed Dr. Loletha Carrow' recommendations. Pt is aware that when he contacts [place] it will be about a 2-3 month wait before we can get him scheduled due to lack of hospital availability. Pt verbalized understanding and had no concerns at the end of the call. ? ?67-monthrecall in epic. ?

## 2021-05-04 ENCOUNTER — Ambulatory Visit (HOSPITAL_COMMUNITY): Admit: 2021-05-04 | Payer: Managed Care, Other (non HMO) | Admitting: Gastroenterology

## 2021-05-04 ENCOUNTER — Encounter (HOSPITAL_COMMUNITY): Payer: Self-pay

## 2021-05-04 SURGERY — COLONOSCOPY WITH PROPOFOL
Anesthesia: Monitor Anesthesia Care

## 2021-09-09 ENCOUNTER — Other Ambulatory Visit: Payer: Self-pay

## 2021-09-09 ENCOUNTER — Telehealth: Payer: Self-pay

## 2021-09-09 DIAGNOSIS — K621 Rectal polyp: Secondary | ICD-10-CM

## 2021-09-09 MED ORDER — PLENVU 140 G PO SOLR: 1.0000 | ORAL | 0 refills | Status: AC

## 2021-09-09 NOTE — Telephone Encounter (Signed)
Called and spoke with patient to schedule his colon EMR at [facility] on 10/15/21 with Dr. Loletha Carrow. Pt is ready to proceed at this time. Pt has been scheduled for colonoscopy with EMR on 10/15/21 at 8:45 am. Pt is aware that he will need to arrive at [facility] by 7:15 am with a care partner. Pt is aware that I will send his prep to his pharmacy on file. Pt knows that he will receive instructions in the mail. Pt verbalized understanding and had no concerns at the end of the call.   Ambulatory referral to GI in epic. Colonoscopy instructions mailed to patient. Plenvu prep sent to [facility] pharmacy on file.

## 2021-09-15 ENCOUNTER — Telehealth: Payer: Self-pay | Admitting: Gastroenterology

## 2021-09-15 NOTE — Telephone Encounter (Signed)
Noted, thanks!

## 2021-09-15 NOTE — Telephone Encounter (Signed)
Colon with EMR has been cancelled at [facility] on 10/12.

## 2021-09-15 NOTE — Telephone Encounter (Signed)
Patient called states he would like to cancel his coming up procedure appointment at [facility] 10/12. States he would be out of town and he would call back if he needs to.

## 2021-09-15 NOTE — Telephone Encounter (Signed)
Thank you for letting me know.  You and I will message separately about other patients who may need that slot.  - HD

## 2021-10-15 ENCOUNTER — Ambulatory Visit (HOSPITAL_COMMUNITY): Admit: 2021-10-15 | Payer: Managed Care, Other (non HMO) | Admitting: Gastroenterology

## 2021-10-15 ENCOUNTER — Encounter (HOSPITAL_COMMUNITY): Payer: Self-pay

## 2021-10-15 SURGERY — COLONOSCOPY WITH PROPOFOL
Anesthesia: Monitor Anesthesia Care

## 2021-11-02 ENCOUNTER — Encounter: Payer: Self-pay | Admitting: Gastroenterology

## 2021-12-09 ENCOUNTER — Telehealth: Payer: Self-pay

## 2021-12-09 NOTE — Telephone Encounter (Signed)
Attempted to reach patient to offer appt at [facility] on 12/17/21 with Dr. Loletha Carrow. Pt unavailable, no answer.

## 2021-12-10 NOTE — Telephone Encounter (Signed)
Attempted to reach patient again to offer colonoscopy appt at [facility] on 12/17/21. Left patient a detailed vm with appt information for 12/17/21 at 7:30 am. Arriving at 6 am with a care partner. I asked that patient give me a call back as soon as he can to confirm whether or not he will be able to take this appt.

## 2022-01-18 ENCOUNTER — Encounter: Payer: Self-pay | Admitting: Gastroenterology

## 2022-02-12 ENCOUNTER — Telehealth: Payer: Self-pay

## 2022-02-12 NOTE — Telephone Encounter (Signed)
Called and left patient a detailed vm offering him an appt on Monday, 02/22/22 to schedule his colon EMR at [facility]. I asked that patient give me a call back ASAP to discuss available times. I informed patient that this appt is not guaranteed and he should call back at his earliest convenience.

## 2022-02-17 NOTE — Telephone Encounter (Signed)
No return call received. Patient is still on hospital wait list and will be contacted once another date becomes available.

## 2022-11-30 ENCOUNTER — Telehealth: Payer: Self-pay

## 2022-11-30 NOTE — Telephone Encounter (Signed)
Left message for pt to call back. Availability at [facility] hospital for 12/09/2022 procedure
